# Patient Record
Sex: Female | Born: 1970 | Race: White | Hispanic: No | Marital: Married | Smoking: Never smoker
Health system: Southern US, Community
[De-identification: ages and names within clinical notes are randomized; demographics above are authoritative.]

## PROBLEM LIST (undated history)

## (undated) DIAGNOSIS — E079 Disorder of thyroid, unspecified: Secondary | ICD-10-CM

## (undated) HISTORY — PX: CHOLECYSTECTOMY: SHX55

---

## 2011-05-10 ENCOUNTER — Other Ambulatory Visit: Payer: Self-pay | Admitting: Family Medicine

## 2011-05-10 ENCOUNTER — Ambulatory Visit
Admission: RE | Admit: 2011-05-10 | Discharge: 2011-05-10 | Disposition: A | Discharge status: Home / Self Care | Payer: 59 | Source: Ambulatory Visit | Attending: Family Medicine | Admitting: Family Medicine

## 2011-05-10 DIAGNOSIS — Z139 Encounter for screening, unspecified: Secondary | ICD-10-CM

## 2013-07-27 ENCOUNTER — Emergency Department (HOSPITAL_COMMUNITY)
Admission: EM | Admit: 2013-07-27 | Discharge: 2013-07-28 | Disposition: A | Discharge status: Home / Self Care | Payer: BC Managed Care – PPO | Attending: Emergency Medicine | Admitting: Emergency Medicine

## 2013-07-27 ENCOUNTER — Encounter (HOSPITAL_COMMUNITY): Payer: Self-pay | Admitting: Emergency Medicine

## 2013-07-27 DIAGNOSIS — E079 Disorder of thyroid, unspecified: Secondary | ICD-10-CM | POA: Insufficient documentation

## 2013-07-27 DIAGNOSIS — N23 Unspecified renal colic: Secondary | ICD-10-CM

## 2013-07-27 DIAGNOSIS — N201 Calculus of ureter: Secondary | ICD-10-CM

## 2013-07-27 HISTORY — DX: Disorder of thyroid, unspecified: E07.9

## 2013-07-27 LAB — COMPREHENSIVE METABOLIC PANEL
ALT: 18 U/L (ref 0–35)
AST: 21 U/L (ref 0–37)
Albumin: 4 g/dL (ref 3.5–5.2)
Alkaline Phosphatase: 85 U/L (ref 39–117)
BUN: 13 mg/dL (ref 6–23)
CHLORIDE: 105 meq/L (ref 96–112)
CO2: 26 meq/L (ref 19–32)
CREATININE: 0.76 mg/dL (ref 0.50–1.10)
Calcium: 9.3 mg/dL (ref 8.4–10.5)
GFR calc Af Amer: >90 mL/min (ref >90)
GFR calc non Af Amer: >90 mL/min (ref >90)
Glucose, Bld: 114 mg/dL — ABNORMAL HIGH (ref 70–99)
Potassium: 3.9 mEq/L (ref 3.7–5.3)
Sodium: 143 mEq/L (ref 137–147)
Total Protein: 7.8 g/dL (ref 6.0–8.3)

## 2013-07-27 LAB — URINALYSIS, ROUTINE W REFLEX MICROSCOPIC
Bilirubin Urine: NEGATIVE
Glucose, UA: NEGATIVE mg/dL
KETONES UR: 15 mg/dL — AB
Nitrite: NEGATIVE
PROTEIN: 30 mg/dL — AB
Specific Gravity, Urine: 1.029 (ref 1.005–1.030)
Urobilinogen, UA: 0.2 mg/dL (ref 0.0–1.0)
pH: 5.5 (ref 5.0–8.0)

## 2013-07-27 LAB — URINE MICROSCOPIC-ADD ON

## 2013-07-27 LAB — CBC WITH DIFFERENTIAL/PLATELET
Basophils Absolute: 0 10*3/uL (ref 0.0–0.1)
Basophils Relative: 0 % (ref 0–1)
Eosinophils Absolute: 0 10*3/uL (ref 0.0–0.7)
Eosinophils Relative: 0 % (ref 0–5)
HEMATOCRIT: 44.4 % (ref 36.0–46.0)
HEMOGLOBIN: 15.1 g/dL — AB (ref 12.0–15.0)
LYMPHS ABS: 1.3 10*3/uL (ref 0.7–4.0)
LYMPHS PCT: 15 % (ref 12–46)
MCH: 28.8 pg (ref 26.0–34.0)
MCHC: 34 g/dL (ref 30.0–36.0)
MCV: 84.6 fL (ref 78.0–100.0)
MONO ABS: 0.5 10*3/uL (ref 0.1–1.0)
Monocytes Relative: 6 % (ref 3–12)
Neutro Abs: 6.6 10*3/uL (ref 1.7–7.7)
Neutrophils Relative %: 79 % — ABNORMAL HIGH (ref 43–77)
Platelets: 203 10*3/uL (ref 150–400)
RBC: 5.25 MIL/uL — ABNORMAL HIGH (ref 3.87–5.11)
RDW: 13.2 % (ref 11.5–15.5)
WBC: 8.5 10*3/uL (ref 4.0–10.5)

## 2013-07-27 LAB — PREGNANCY, URINE: PREG TEST UR: NEGATIVE

## 2013-07-27 MED ORDER — MORPHINE SULFATE 4 MG/ML IJ SOLN
4.0000 mg | Freq: Once | INTRAMUSCULAR | Status: AC
  Administered 2013-07-27: 4 mg via INTRAVENOUS
  Filled 2013-07-27: qty 1

## 2013-07-27 MED ORDER — IOHEXOL 300 MG/ML  SOLN
20.0000 mL | INTRAMUSCULAR | Status: AC
  Administered 2013-07-27: 25 mL via ORAL

## 2013-07-27 MED ORDER — ONDANSETRON HCL 4 MG/2ML IJ SOLN
4.0000 mg | Freq: Once | INTRAMUSCULAR | Status: AC
Start: 2013-07-27 — End: 2013-07-27
  Administered 2013-07-27: 4 mg via INTRAVENOUS
  Filled 2013-07-27: qty 2

## 2013-07-27 NOTE — ED Provider Notes (Signed)
CSN: [id number]     Arrival date & time 07/27/13  2107 History   First MD Initiated Contact with Patient 07/27/13 2151     Chief Complaint  Patient presents with  . Abdominal Pain     (Consider location/radiation/quality/duration/timing/severity/associated sxs/prior Treatment) HPI Comments: Patient is a 43 year old female with history of cholecystectomy in the past. She presents today with complaints of pain in the right abdomen. She said this started this morning. She has become increasingly nauseated and actually vomited one time. She denies any fevers or chills. She denies any urinary complaints. Her last menstrual period was one month ago and normal. She denies the possibility of pregnancy.  She was seen by her primary Dr. this afternoon and advised to come here for workup and rule out of appendicitis.  Patient is a 43 y.o. female presenting with abdominal pain. The history is provided by the patient.  Abdominal Pain Pain location:  RLQ Pain quality: cramping   Pain radiates to:  Does not radiate Pain severity:  Moderate Onset quality:  Gradual Duration:  1 day Timing:  Constant Progression:  Worsening Chronicity:  New Relieved by:  Nothing Worsened by:  Nothing tried Ineffective treatments:  None tried Associated symptoms: chills and nausea   Associated symptoms: no fever     Past Medical History  Diagnosis Date  . Thyroid disease    Past Surgical History  Procedure Laterality Date  . Cholecystectomy     No family history on file. History  Substance Use Topics  . Smoking status: Never Smoker   . Smokeless tobacco: Not on file  . Alcohol Use: No   OB History   Grav Para Term Preterm Abortions TAB SAB Ect Mult Living                 Review of Systems  Constitutional: Positive for chills. Negative for fever.  Gastrointestinal: Positive for nausea and abdominal pain.  All other systems reviewed and are negative.     Allergies  Codeine  Home Medications    Prior to Admission medications   Medication Sig Start Date End Date Taking? Authorizing Provider  B Complex Vitamins (VITAMIN B COMPLEX PO) Take 1 tablet by mouth daily.   Yes Historical Provider, MD  cholecalciferol (VITAMIN D) 1000 UNITS tablet Take 1,000 Units by mouth daily.   Yes Historical Provider, MD  Cyanocobalamin (VITAMIN B-12 PO) Take 1 tablet by mouth daily.   Yes Historical Provider, MD  levothyroxine (SYNTHROID, LEVOTHROID) 100 MCG tablet Take 100 mcg by mouth daily before breakfast.   Yes Historical Provider, MD  loratadine (CLARITIN) 10 MG tablet Take 10 mg by mouth daily.   Yes Historical Provider, MD  PRESCRIPTION MEDICATION Take 1 tablet by mouth daily.   Yes Historical Provider, MD  sodium chloride (OCEAN) 0.65 % SOLN nasal spray Place 1 spray into both nostrils 2 (two) times daily as needed for congestion.   Yes Historical Provider, MD   BP 141/95  Pulse 84  Temp(Src) 98.5 F (36.9 C) (Oral)  Resp 14  Ht 5\' 1"  (1.549 m)  Wt 203 lb (92.08 kg)  BMI 38.38 kg/m2  SpO2 99%  LMP 06/27/2013 Physical Exam  Nursing note and vitals reviewed. Constitutional: She is oriented to person, place, and time. She appears well-developed and well-nourished. No distress.  HENT:  Head: Normocephalic and atraumatic.  Neck: Normal range of motion. Neck supple.  Cardiovascular: Normal rate and regular rhythm.  Exam reveals no gallop and no friction rub.  No murmur heard. Pulmonary/Chest: Effort normal and breath sounds normal. No respiratory distress. She has no wheezes.  Abdominal: Soft. Bowel sounds are normal. She exhibits no distension. There is tenderness.  There is tenderness to palpation in the right lower quadrant. There is no rebound and no guarding. Bowel sounds are present.  Musculoskeletal: Normal range of motion.  Neurological: She is alert and oriented to person, place, and time.  Skin: Skin is warm and dry. She is not diaphoretic.    ED Course  Procedures  (including critical care time) Labs Review Labs Reviewed  CBC WITH DIFFERENTIAL - Abnormal; Notable for the following:    RBC 5.25 (*)    Hemoglobin 15.1 (*)    Neutrophils Relative % 79 (*)    All other components within normal limits  PREGNANCY, URINE  COMPREHENSIVE METABOLIC PANEL  URINALYSIS, ROUTINE W REFLEX MICROSCOPIC    Imaging Review No results found.   EKG Interpretation None      MDM   Final diagnoses:  None    Patient presents with complaints of right lower quadrant abdominal pain. She was sent here from the primary doctor's office for evaluation and rule out of appendicitis. Her white count is normal, however she does have a good exam for appendicitis. CT scan will be obtained. Care will be signed at shift change Dr. Lavella LemonsManly. She will obtain the results of the CT and determine the final disposition.    Geoffery Lyonsouglas Najwa Spillane, MD 07/30/13 [phone]

## 2013-07-27 NOTE — ED Notes (Signed)
Pt c/o lower rt quadrant pain that started this morning. Pt has had nausea/vomiting. [facility] and was sent her to rule out appendicitis. Pt is worse when she eats, gets better when she throws up. Pt rates pain 8-9/10. Describes pain as dull with occasional sharp pains.

## 2013-07-27 NOTE — ED Notes (Signed)
Pt. reports right/low abdominal pain with nausea and vomitting onset today with no fever or chills, seen at [facility] advised to go to ER to rule out appendicitis .

## 2013-07-28 ENCOUNTER — Emergency Department (HOSPITAL_COMMUNITY): Payer: BC Managed Care – PPO

## 2013-07-28 MED ORDER — IBUPROFEN 600 MG PO TABS: 600.0000 mg | ORAL_TABLET | Freq: Four times a day (QID) | ORAL | Status: DC | PRN

## 2013-07-28 MED ORDER — HYDROCODONE-ACETAMINOPHEN 5-325 MG PO TABS: 1.0000 | ORAL_TABLET | ORAL | Status: DC | PRN

## 2013-07-28 MED ORDER — TAMSULOSIN HCL 0.4 MG PO CAPS: 0.4000 mg | ORAL_CAPSULE | Freq: Every day | ORAL | Status: DC

## 2013-07-28 MED ORDER — MORPHINE SULFATE 4 MG/ML IJ SOLN
4.0000 mg | Freq: Once | INTRAMUSCULAR | Status: AC
  Administered 2013-07-28: 4 mg via INTRAVENOUS
  Filled 2013-07-28: qty 1

## 2013-07-28 MED ORDER — ONDANSETRON HCL 4 MG PO TABS: 4.0000 mg | ORAL_TABLET | Freq: Four times a day (QID) | ORAL | Status: DC

## 2013-07-28 MED ORDER — IOHEXOL 300 MG/ML  SOLN
100.0000 mL | Freq: Once | INTRAMUSCULAR | Status: AC | PRN
  Administered 2013-07-28: 100 mL via INTRAVENOUS

## 2013-07-28 NOTE — ED Notes (Signed)
Pt returned from ct

## 2013-07-28 NOTE — Discharge Instructions (Signed)
Kidney Stones Kidney stones (urolithiasis) are solid masses that form inside your kidneys. The intense pain is caused by the stone moving through the kidney, ureter, bladder, and urethra (urinary tract). When the stone moves, the ureter starts to spasm around the stone. The stone is usually passed in your pee (urine).  HOME CARE  Drink enough fluids to keep your pee clear or pale yellow. This helps to get the stone out.  Strain all pee through the provided strainer. Do not pee without peeing through the strainer, not even once. If you pee the stone out, catch it in the strainer. The stone may be as small as a grain of salt. Take this to your doctor. This will help your doctor figure out what you can do to try to prevent more kidney stones.  Only take medicine as told by your doctor.  Follow up with your doctor as told.  Get follow-up X-rays as told by your doctor. GET HELP IF: You have pain that gets worse even if you have been taking pain medicine. GET HELP RIGHT AWAY IF:   Your pain does not get better with medicine.  You have a fever or shaking chills.  Your pain increases and gets worse over 18 hours.  You have new belly (abdominal) pain.  You feel faint or pass out.  You are unable to pee. MAKE SURE YOU:   Understand these instructions.  Will watch your condition.  Will get help right away if you are not doing well or get worse. Document Released: 09/01/2007 Document Revised: 11/15/2012 Document Reviewed: 08/16/2012 [facility] Patient Information 2014 [place], [place].   PLEASE RETURN TO THE ED IF YOU DEVELOP FEVER, PAIN UNCONTROLLED BY PRESCRIBED MEDICATIONS OR IF YOU HAVE ANY OTHER URGENT HEALTH CONCERNS.

## 2013-07-28 NOTE — ED Provider Notes (Signed)
CT a/p reveals 7mm distal right ureteral stone. Patient required another dose of pain medication while awaiting results of CT scan. However, on my most recent visit with her, she is feeling better and says she is ready to go home. I have reviewed her diagnosis with her as well as plan for outpatient treatment and urology follow up. Patient counseled re: return precautions. Stable for discharge at this time.   Brandt LoosenJulie Manly, MD 07/28/13 [phone]

## 2013-07-28 NOTE — ED Notes (Signed)
Patient transported to CT 

## 2013-07-28 NOTE — ED Notes (Signed)
Discharge and follow up instructions reviewed. Pt verbalized understanding.  

## 2013-10-18 ENCOUNTER — Other Ambulatory Visit (HOSPITAL_COMMUNITY)
Admission: RE | Admit: 2013-10-18 | Discharge: 2013-10-18 | Disposition: A | Discharge status: Home / Self Care | Payer: BC Managed Care – PPO | Source: Ambulatory Visit | Attending: Family Medicine | Admitting: Family Medicine

## 2013-10-18 ENCOUNTER — Other Ambulatory Visit: Payer: Self-pay | Admitting: Family Medicine

## 2013-10-18 DIAGNOSIS — Z1151 Encounter for screening for human papillomavirus (HPV): Secondary | ICD-10-CM | POA: Insufficient documentation

## 2013-10-18 DIAGNOSIS — Z01419 Encounter for gynecological examination (general) (routine) without abnormal findings: Secondary | ICD-10-CM | POA: Insufficient documentation

## 2013-10-19 LAB — CYTOLOGY - PAP

## 2014-09-12 ENCOUNTER — Other Ambulatory Visit: Payer: Self-pay

## 2014-09-12 DIAGNOSIS — Z1231 Encounter for screening mammogram for malignant neoplasm of breast: Secondary | ICD-10-CM

## 2014-09-19 ENCOUNTER — Ambulatory Visit
Admission: RE | Admit: 2014-09-19 | Discharge: 2014-09-19 | Disposition: A | Discharge status: Home / Self Care | Payer: BC Managed Care – PPO | Source: Ambulatory Visit

## 2014-09-19 DIAGNOSIS — Z1231 Encounter for screening mammogram for malignant neoplasm of breast: Secondary | ICD-10-CM

## 2015-10-06 ENCOUNTER — Other Ambulatory Visit: Payer: Self-pay | Admitting: Family Medicine

## 2015-10-06 DIAGNOSIS — Z1231 Encounter for screening mammogram for malignant neoplasm of breast: Secondary | ICD-10-CM

## 2015-10-30 ENCOUNTER — Ambulatory Visit
Admission: RE | Admit: 2015-10-30 | Discharge: 2015-10-30 | Disposition: A | Discharge status: Home / Self Care | Payer: BC Managed Care – PPO | Source: Ambulatory Visit | Attending: Family Medicine | Admitting: Family Medicine

## 2015-10-30 DIAGNOSIS — Z1231 Encounter for screening mammogram for malignant neoplasm of breast: Secondary | ICD-10-CM

## 2016-10-20 ENCOUNTER — Other Ambulatory Visit: Payer: Self-pay | Admitting: Family Medicine

## 2016-10-20 DIAGNOSIS — Z1231 Encounter for screening mammogram for malignant neoplasm of breast: Secondary | ICD-10-CM

## 2016-10-29 ENCOUNTER — Other Ambulatory Visit: Payer: Self-pay | Admitting: Family Medicine

## 2016-10-29 ENCOUNTER — Other Ambulatory Visit (HOSPITAL_COMMUNITY)
Admission: RE | Admit: 2016-10-29 | Discharge: 2016-10-29 | Disposition: A | Discharge status: Home / Self Care | Payer: BC Managed Care – PPO | Source: Ambulatory Visit | Attending: Family Medicine | Admitting: Family Medicine

## 2016-10-29 DIAGNOSIS — Z01411 Encounter for gynecological examination (general) (routine) with abnormal findings: Secondary | ICD-10-CM | POA: Insufficient documentation

## 2016-11-01 ENCOUNTER — Ambulatory Visit
Admission: RE | Admit: 2016-11-01 | Discharge: 2016-11-01 | Disposition: A | Discharge status: Home / Self Care | Payer: BC Managed Care – PPO | Source: Ambulatory Visit | Attending: Family Medicine | Admitting: Family Medicine

## 2016-11-01 DIAGNOSIS — Z1231 Encounter for screening mammogram for malignant neoplasm of breast: Secondary | ICD-10-CM

## 2016-11-01 LAB — CYTOLOGY - PAP
Diagnosis: NEGATIVE
HPV: NOT DETECTED

## 2017-10-26 ENCOUNTER — Other Ambulatory Visit: Payer: Self-pay | Admitting: Family Medicine

## 2017-10-26 DIAGNOSIS — Z1231 Encounter for screening mammogram for malignant neoplasm of breast: Secondary | ICD-10-CM

## 2017-11-24 ENCOUNTER — Ambulatory Visit
Admission: RE | Admit: 2017-11-24 | Discharge: 2017-11-24 | Disposition: A | Discharge status: Home / Self Care | Payer: BC Managed Care – PPO | Source: Ambulatory Visit | Attending: Family Medicine | Admitting: Family Medicine

## 2017-11-24 DIAGNOSIS — Z1231 Encounter for screening mammogram for malignant neoplasm of breast: Secondary | ICD-10-CM

## 2018-10-17 ENCOUNTER — Other Ambulatory Visit: Payer: Self-pay | Admitting: Family Medicine

## 2018-10-17 DIAGNOSIS — Z1231 Encounter for screening mammogram for malignant neoplasm of breast: Secondary | ICD-10-CM

## 2018-11-29 ENCOUNTER — Ambulatory Visit
Admission: RE | Admit: 2018-11-29 | Discharge: 2018-11-29 | Disposition: A | Discharge status: Home / Self Care | Payer: BC Managed Care – PPO | Source: Ambulatory Visit | Attending: Family Medicine | Admitting: Family Medicine

## 2018-11-29 ENCOUNTER — Other Ambulatory Visit: Payer: Self-pay

## 2018-11-29 DIAGNOSIS — Z1231 Encounter for screening mammogram for malignant neoplasm of breast: Secondary | ICD-10-CM

## 2020-02-01 ENCOUNTER — Other Ambulatory Visit: Payer: Self-pay | Admitting: Family Medicine

## 2020-02-01 DIAGNOSIS — Z1231 Encounter for screening mammogram for malignant neoplasm of breast: Secondary | ICD-10-CM

## 2020-02-04 ENCOUNTER — Other Ambulatory Visit: Payer: Self-pay

## 2020-02-04 ENCOUNTER — Ambulatory Visit
Admission: RE | Admit: 2020-02-04 | Discharge: 2020-02-04 | Disposition: A | Discharge status: Home / Self Care | Payer: BC Managed Care – PPO | Source: Ambulatory Visit | Attending: Family Medicine | Admitting: Family Medicine

## 2020-02-04 DIAGNOSIS — Z1231 Encounter for screening mammogram for malignant neoplasm of breast: Secondary | ICD-10-CM

## 2020-04-24 ENCOUNTER — Other Ambulatory Visit (HOSPITAL_COMMUNITY)
Admission: RE | Admit: 2020-04-24 | Discharge: 2020-04-24 | Disposition: A | Discharge status: Home / Self Care | Payer: BC Managed Care – PPO | Source: Ambulatory Visit | Attending: Family Medicine | Admitting: Family Medicine

## 2020-04-24 DIAGNOSIS — Z01411 Encounter for gynecological examination (general) (routine) with abnormal findings: Secondary | ICD-10-CM | POA: Insufficient documentation

## 2020-04-29 LAB — CYTOLOGY - PAP
Diagnosis: NEGATIVE
Diagnosis: REACTIVE

## 2021-09-08 ENCOUNTER — Encounter (HOSPITAL_COMMUNITY): Payer: Self-pay | Admitting: Emergency Medicine

## 2021-09-08 ENCOUNTER — Emergency Department (HOSPITAL_COMMUNITY)
Admission: EM | Admit: 2021-09-08 | Discharge: 2021-09-08 | Disposition: A | Discharge status: Home / Self Care | Payer: BC Managed Care – PPO | Attending: Emergency Medicine | Admitting: Emergency Medicine

## 2021-09-08 ENCOUNTER — Emergency Department (HOSPITAL_COMMUNITY): Payer: BC Managed Care – PPO

## 2021-09-08 ENCOUNTER — Other Ambulatory Visit: Payer: Self-pay

## 2021-09-08 DIAGNOSIS — R072 Precordial pain: Secondary | ICD-10-CM | POA: Diagnosis present

## 2021-09-08 DIAGNOSIS — R079 Chest pain, unspecified: Secondary | ICD-10-CM

## 2021-09-08 DIAGNOSIS — R002 Palpitations: Secondary | ICD-10-CM | POA: Diagnosis not present

## 2021-09-08 LAB — BASIC METABOLIC PANEL
Anion gap: 10 (ref 5–15)
BUN: 11 mg/dL (ref 6–20)
CO2: 25 mmol/L (ref 22–32)
Calcium: 9.7 mg/dL (ref 8.9–10.3)
Chloride: 104 mmol/L (ref 98–111)
Creatinine, Ser: 0.64 mg/dL (ref 0.44–1.00)
GFR, Estimated: >60 mL/min (ref >60)
Glucose, Bld: 104 mg/dL — ABNORMAL HIGH (ref 70–99)
Potassium: 3.5 mmol/L (ref 3.5–5.1)
Sodium: 139 mmol/L (ref 135–145)

## 2021-09-08 LAB — CBC
HCT: 46.7 % — ABNORMAL HIGH (ref 36.0–46.0)
Hemoglobin: 15.9 g/dL — ABNORMAL HIGH (ref 12.0–15.0)
MCH: 29.2 pg (ref 26.0–34.0)
MCHC: 34 g/dL (ref 30.0–36.0)
MCV: 85.8 fL (ref 80.0–100.0)
Platelets: 243 10*3/uL (ref 150–400)
RBC: 5.44 MIL/uL — ABNORMAL HIGH (ref 3.87–5.11)
RDW: 13.1 % (ref 11.5–15.5)
WBC: 8.5 10*3/uL (ref 4.0–10.5)
nRBC: 0 % (ref 0.0–0.2)

## 2021-09-08 LAB — TROPONIN I (HIGH SENSITIVITY): Troponin I (High Sensitivity): 2 ng/L (ref <18)

## 2021-09-08 MED ORDER — METOPROLOL SUCCINATE ER 25 MG PO TB24: 12.5000 mg | ORAL_TABLET | Freq: Every day | ORAL | 2 refills | Status: AC

## 2021-09-08 NOTE — Discharge Instructions (Signed)
A prescription for metoprolol was sent to your pharmacy.  This is a medicine that treats blood pressure and palpitations.  You are prescribed a very low dose.  Continue to check your blood pressure and heart rate at home.  Talk to your primary care doctor about your response to this medication and any benefit you might get from dosing changes.  Return to the emergency department at any time for any new or worsening symptoms of concern.

## 2021-09-08 NOTE — ED Triage Notes (Addendum)
Pt reports she had chest pain intermittent since last night, pt reports elevated BP (168/110); palpitations with tingling to left arm when she lays flat; pt reports a physically exhausting day yesterday, pt reports her PCP recently prescribed ozempic for weight loss and palpitations and BP have been irregular since

## 2021-09-08 NOTE — ED Provider Notes (Signed)
[facility]-EMERGENCY DEPT Provider Note   CSN: [id number] Arrival date & time: 09/08/21  0445     History  Chief Complaint  Patient presents with   Chest Pain    Cristina Brown is a 51 y.o. female.   Chest Pain Associated symptoms: palpitations   Patient presents for chest pain.  Onset was last night.  Symptoms have been persistent since that time.  Patient describes her recent history as follows: She was started on Ozempic several months ago.  4 weeks ago, she increased her dose.  Since that dose increase, she has experienced intermittent palpitations.  She closely tracks her heart rate and blood pressure at home.  She has noticed an increase resting heart rate and elevated blood pressures.  She has no history of hypertension and does not take any medications for high blood pressure.  She had a physically active day yesterday.  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  Last night, she was laying in bed, she experienced a substernal chest discomfort.  She also experienced palpitations at this time and noticed that her blood pressure was again elevated.  Her symptoms have subsided this morning.  Patient has no known cardiac history and no family history.  She has been told that her bad cholesterol numbers elevated.  She has no other known risk factors for CAD.       Home Medications Prior to Admission medications   Medication Sig Start Date End Date Taking? Authorizing Provider  acetaminophen (TYLENOL) 325 MG tablet Take 650 mg by mouth daily as needed for moderate pain.   Yes [provider]  cholecalciferol (VITAMIN D) 1000 UNITS tablet Take 1,000 Units by mouth daily.   Yes [provider]  Cyanocobalamin (VITAMIN B-12 PO) Take 1 tablet by mouth daily.   Yes [provider]  levothyroxine (SYNTHROID, LEVOTHROID) 100 MCG tablet Take 100 mcg by mouth daily before breakfast.   Yes [provider]  loratadine  (CLARITIN) 10 MG tablet Take 10 mg by mouth daily.   Yes [provider]  metoprolol succinate (TOPROL-XL) 25 MG 24 hr tablet Take 0.5 tablets (12.5 mg total) by mouth daily. 09/08/21 12/07/21 Yes Gloris Manchester, MD  OZEMPIC, 1 MG/DOSE, 4 MG/3ML SOPN Inject 1 mg into the skin once a week. Tuesday 08/06/21  Yes [provider]      Allergies    Codeine    Review of Systems   Review of Systems  Cardiovascular:  Positive for chest pain and palpitations.  All other systems reviewed and are negative.   Physical Exam Updated Vital Signs BP (!) 158/101   Pulse 95   Temp 98.3 F (36.8 C) (Oral)   Resp 15   Ht 5\' 1"  (1.549 m)   Wt 88.9 kg   SpO2 98%   BMI 37.03 kg/m  Physical Exam Vitals and nursing note reviewed.  Constitutional:      General: She is not in acute distress.    Appearance: She is well-developed and normal weight. She is not ill-appearing, toxic-appearing or diaphoretic.  HENT:     Head: Normocephalic and atraumatic.  Eyes:     Extraocular Movements: Extraocular movements intact.     Conjunctiva/sclera: Conjunctivae normal.  Neck:     Vascular: No JVD.  Cardiovascular:     Rate and Rhythm: Normal rate and regular rhythm.     Heart sounds: Normal heart sounds. No murmur heard. Pulmonary:     Effort: Pulmonary  effort is normal. No respiratory distress.     Breath sounds: Normal breath sounds. No decreased breath sounds, wheezing, rhonchi or rales.  Abdominal:     Palpations: Abdomen is soft.     Tenderness: There is no abdominal tenderness.  Musculoskeletal:        General: No swelling. Normal range of motion.     Cervical back: Normal range of motion and neck supple.     Right lower leg: No edema.     Left lower leg: No edema.  Skin:    General: Skin is warm and dry.     Coloration: Skin is not cyanotic or pale.  Neurological:     General: No focal deficit present.     Mental Status: She is alert and oriented to person, place, and time.      Cranial Nerves: No cranial nerve deficit.     Motor: No weakness.  Psychiatric:        Mood and Affect: Mood normal.        Behavior: Behavior normal.     ED Results / Procedures / Treatments   Labs (all labs ordered are listed, but only abnormal results are displayed) Labs Reviewed  BASIC METABOLIC PANEL - Abnormal; Notable for the following components:      Result Value   Glucose, Bld 104 (*)    All other components within normal limits  CBC - Abnormal; Notable for the following components:   RBC 5.44 (*)    Hemoglobin 15.9 (*)    HCT 46.7 (*)    All other components within normal limits  TROPONIN I (HIGH SENSITIVITY)  TROPONIN I (HIGH SENSITIVITY)    EKG EKG Interpretation  Date/Time:  Tuesday September 08 2021 05:06:56 EDT Ventricular Rate:  95 PR Interval:  121 QRS Duration: 79 QT Interval:  346 QTC Calculation: 435 R Axis:   4 Text Interpretation: Sinus rhythm LVH by voltage Confirmed by Gloris Manchesterixon, Lyana Asbill ([id number]) on 09/08/2021 7:19:08 AM  Radiology DG Chest 2 View  Result Date: 09/08/2021 CLINICAL DATA:  51 year old female with chest pain, hypertension, palpitations. EXAM: CHEST - 2 VIEW COMPARISON:  Chest radiographs 05/10/2011. FINDINGS: Lower lung volumes. Mediastinal contours remain normal. Visualized tracheal air column is within normal limits. No pneumothorax, pulmonary edema, pleural effusion, or confluent pulmonary opacity. Stable cholecystectomy clips. No acute osseous abnormality identified. Negative visible bowel gas. IMPRESSION: Low lung volumes.  No acute cardiopulmonary abnormality. Electronically Signed   By: Odessa FlemingH  Hall M.D.   On: 09/08/2021 05:41    Procedures Procedures    Medications Ordered in ED Medications - No data to display  ED Course/ Medical Decision Making/ A&P                           Medical Decision Making Amount and/or Complexity of Data Reviewed Labs: ordered. Radiology: ordered.   This patient presents to the ED for concern of chest  pain, this involves an extensive number of treatment options, and is a complaint that carries with it a high risk of complications and morbidity.  The differential diagnosis includes ACS, pericarditis, anxiety, costochondritis   Co morbidities that complicate the patient evaluation  N/A   Additional history obtained:  Additional history obtained from N/A External records from outside source obtained and reviewed including EMR   Lab Tests:  I Ordered, and personally interpreted labs.  The pertinent results include: Normal electrolytes, no leukocytosis, normal troponin   Imaging Studies ordered:  I ordered imaging studies including chest x-ray I independently visualized and interpreted imaging which showed no acute findings I agree with the radiologist interpretation   Cardiac Monitoring: / EKG:  The patient was maintained on a cardiac monitor.  I personally viewed and interpreted the cardiac monitored which showed an underlying rhythm of: Sinus rhythm  Problem List / ED Course / Critical interventions / Medication management  Patient is a 51 year old female presenting for chest pain that she noticed last night while laying in bed.  Chest pain is currently resolved.  Prior to being bedded in the ED, patient underwent laboratory work-up.  Results of lab work are reassuring.  Patient's troponin is 2.  Vital signs are notable for moderate hypertension.  On assessment, patient is well-appearing.  She denies any current symptoms.  Based on EKG, risk factors, and symptoms, patient is low risk for CAD.  She was offered cardiology referral but stated that she would feel more comfortable just following up with her primary care doctor.  She was offered IV fluids and repeat troponin which she declined.  She states that she would like to go home.  I spoke with her about her elevated blood pressures and heart rate at home.  Patient would like to try low-dose metoprolol and follow-up with her primary  care doctor to discuss how her vital signs respond.  This was prescribed.  Patient is reliable and very closely tracks her vital signs and symptoms at home.  Given that her symptoms began last night, over 6 hours prior to her first troponin, I do not feel that she needs a second troponin.  Patient is stable for discharge at this time.   I have reviewed the patients home medicines and have made adjustments as needed   Social Determinants of Health:  Has access to outpatient care         Final Clinical Impression(s) / ED Diagnoses Final diagnoses:  Chest pain, unspecified type  Palpitations    Rx / DC Orders ED Discharge Orders          Ordered    metoprolol succinate (TOPROL-XL) 25 MG 24 hr tablet  Daily        09/08/21 0811              Gloris Manchester, MD 09/08/21 [phone]

## 2021-10-26 ENCOUNTER — Other Ambulatory Visit: Payer: Self-pay | Admitting: Family Medicine

## 2021-10-26 DIAGNOSIS — Z1231 Encounter for screening mammogram for malignant neoplasm of breast: Secondary | ICD-10-CM

## 2021-11-03 ENCOUNTER — Ambulatory Visit
Admission: RE | Admit: 2021-11-03 | Discharge: 2021-11-03 | Disposition: A | Discharge status: Home / Self Care | Payer: BC Managed Care – PPO | Source: Ambulatory Visit | Attending: Family Medicine | Admitting: Family Medicine

## 2021-11-03 DIAGNOSIS — Z1231 Encounter for screening mammogram for malignant neoplasm of breast: Secondary | ICD-10-CM

## 2022-04-19 IMAGING — MG DIGITAL SCREENING BILAT W/ TOMO W/ CAD
5 series · 6 of 13 positions shown · non-contrast
Comparison: Previous exam(s).

CLINICAL DATA: Screening.

EXAM:
DIGITAL SCREENING BILATERAL MAMMOGRAM WITH TOMO AND CAD

[R MLO synth-2D]
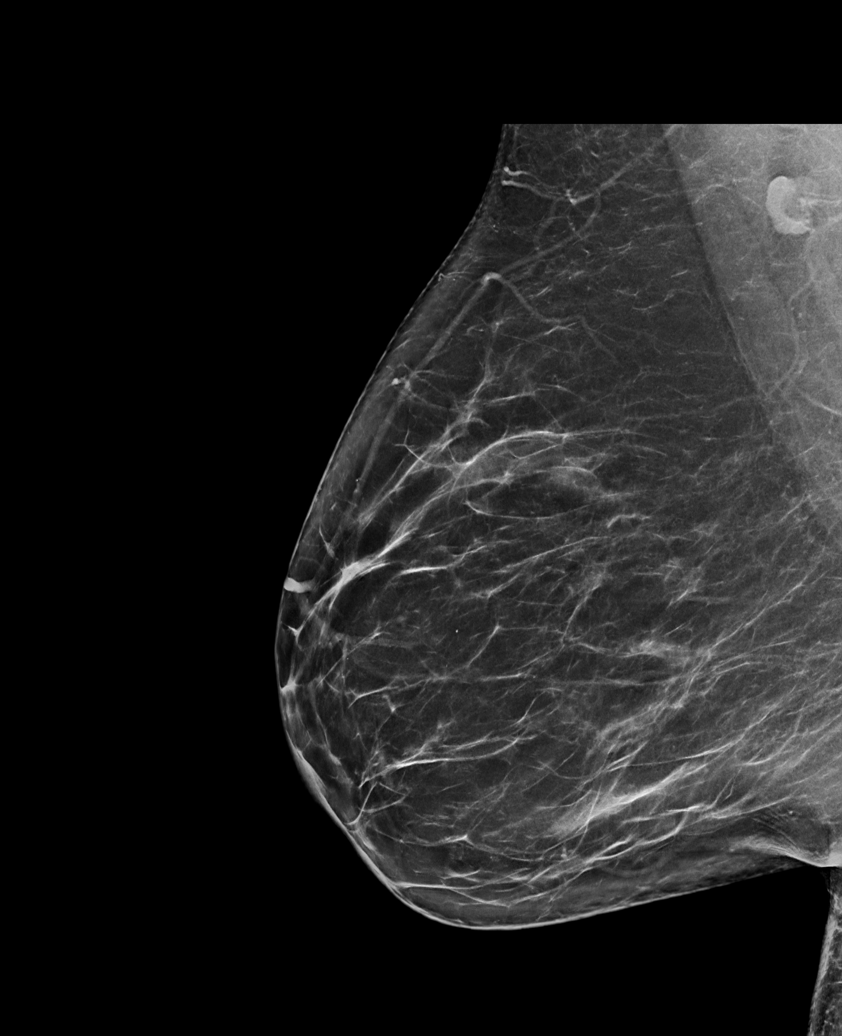

[R CC synth-2D]
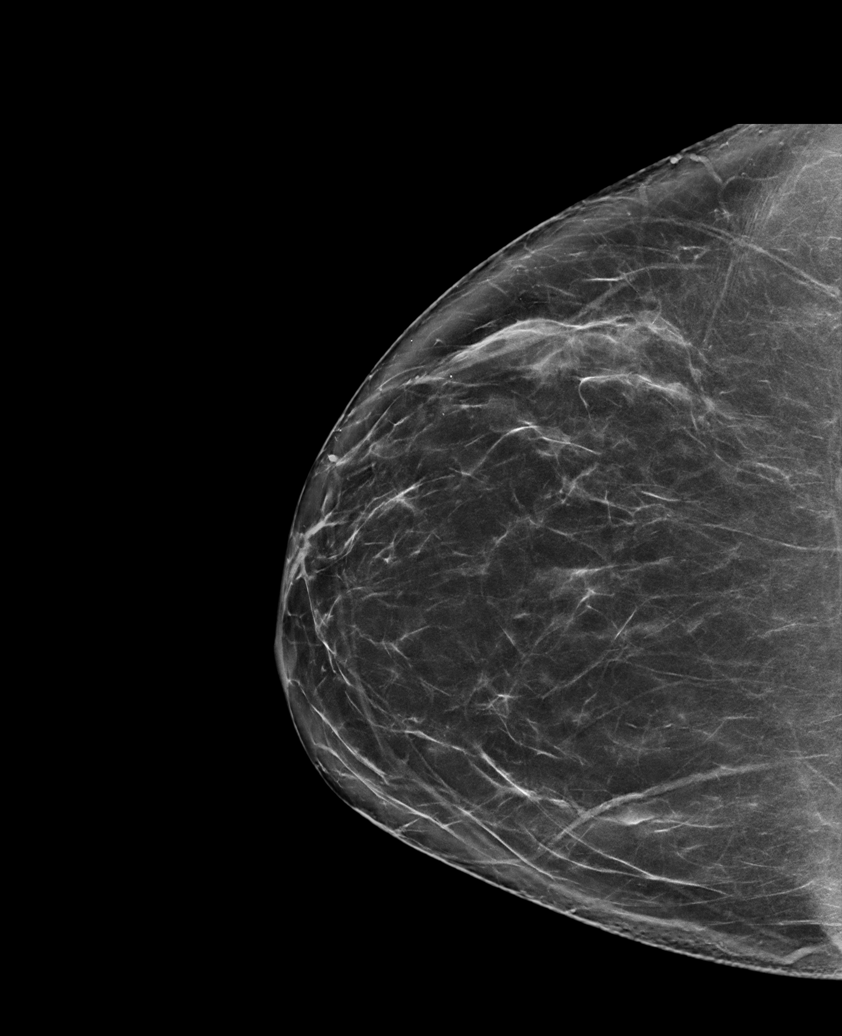

[L CC synth-2D]
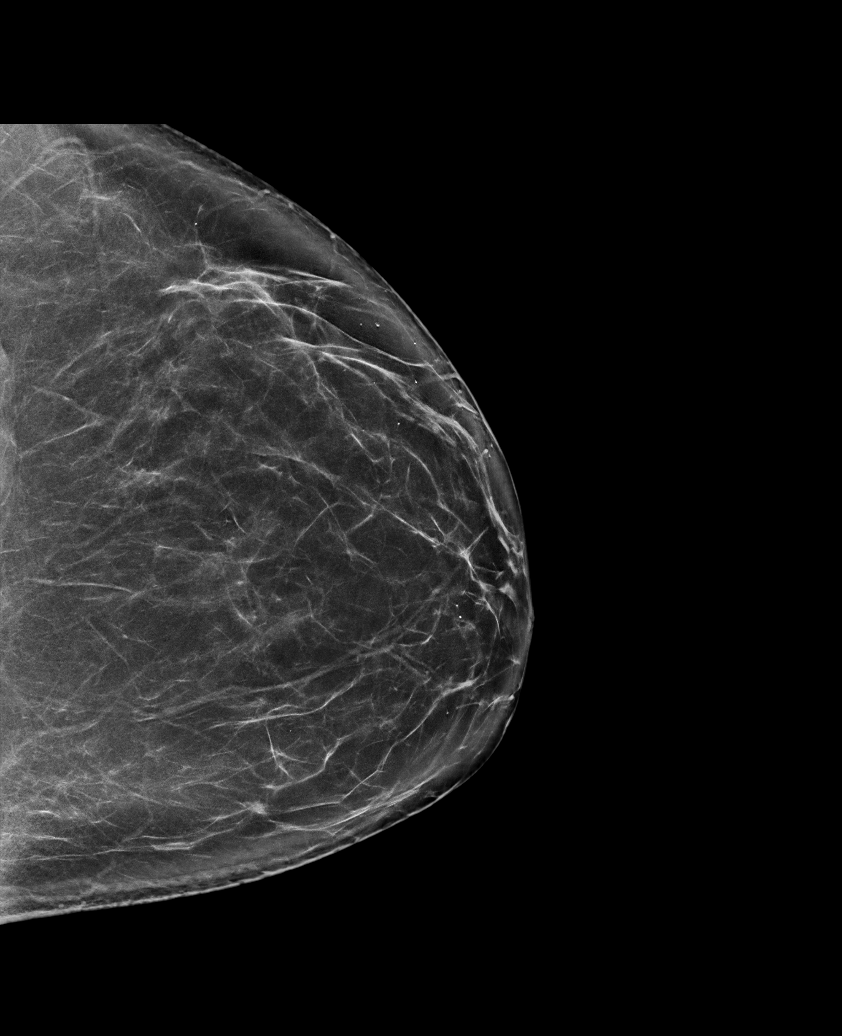

[L MLO tomo · 2 of 90 frames shown]
[frame 29/90]
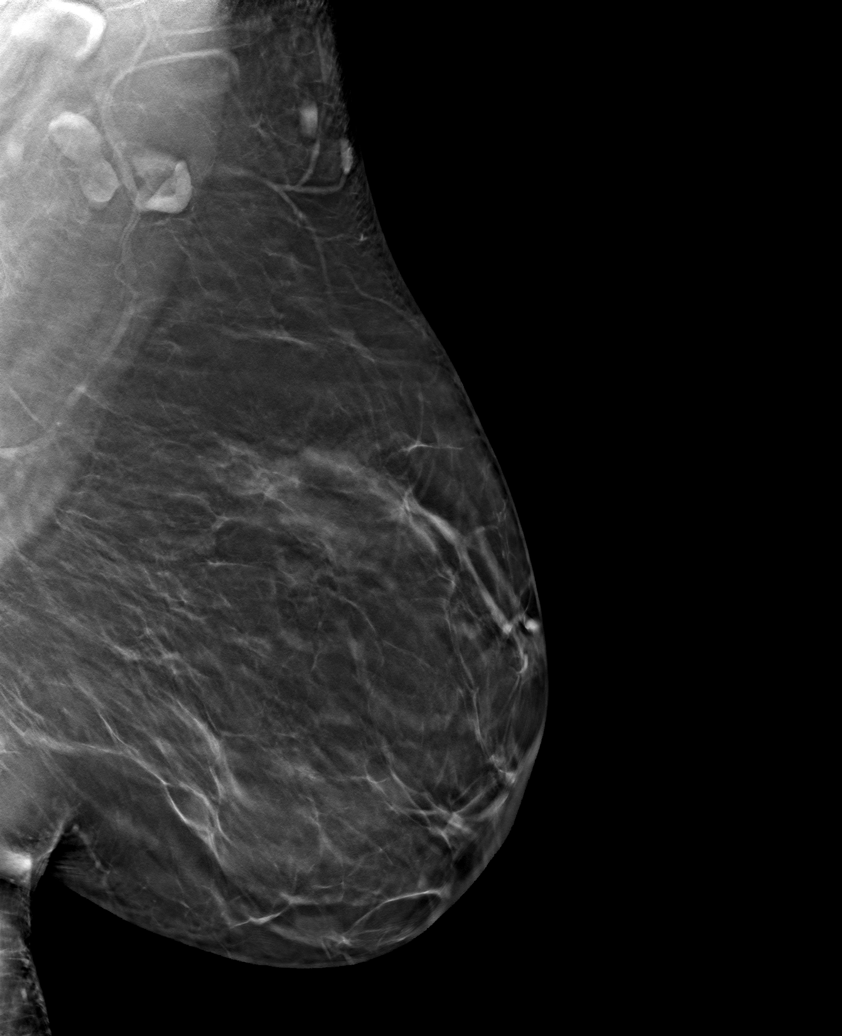
[frame 45/90]
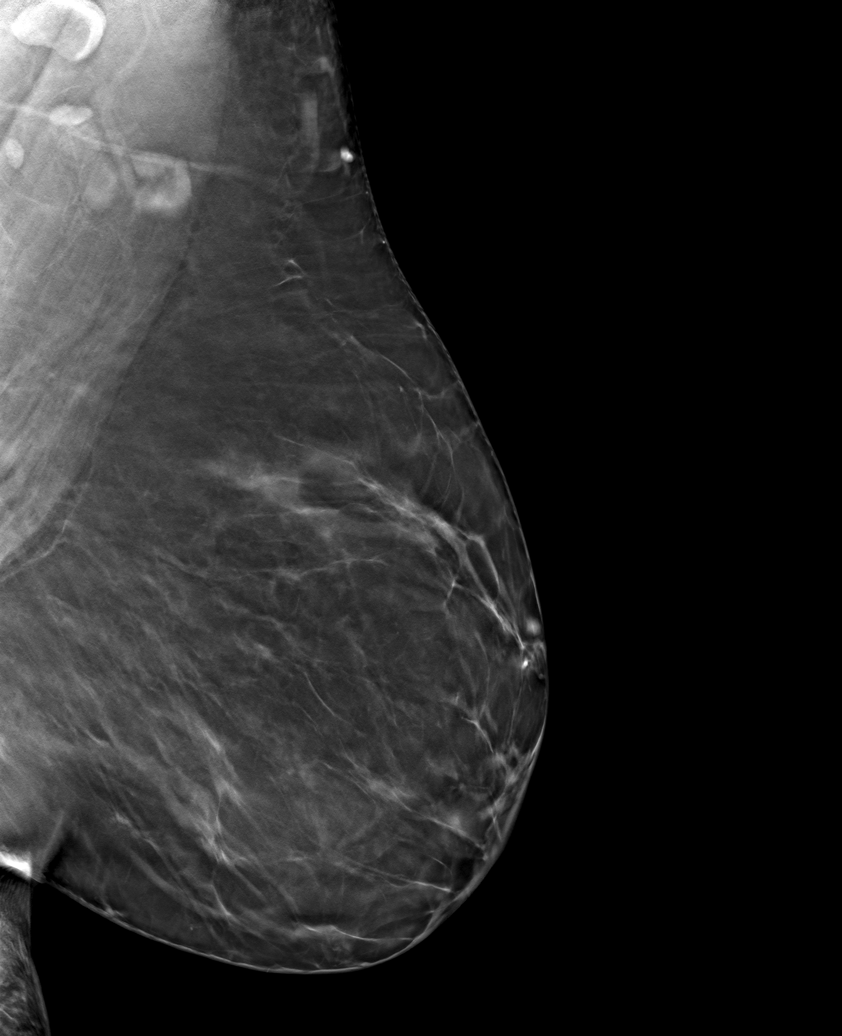

[R MLO tomo · tomo slice 44/87.0]
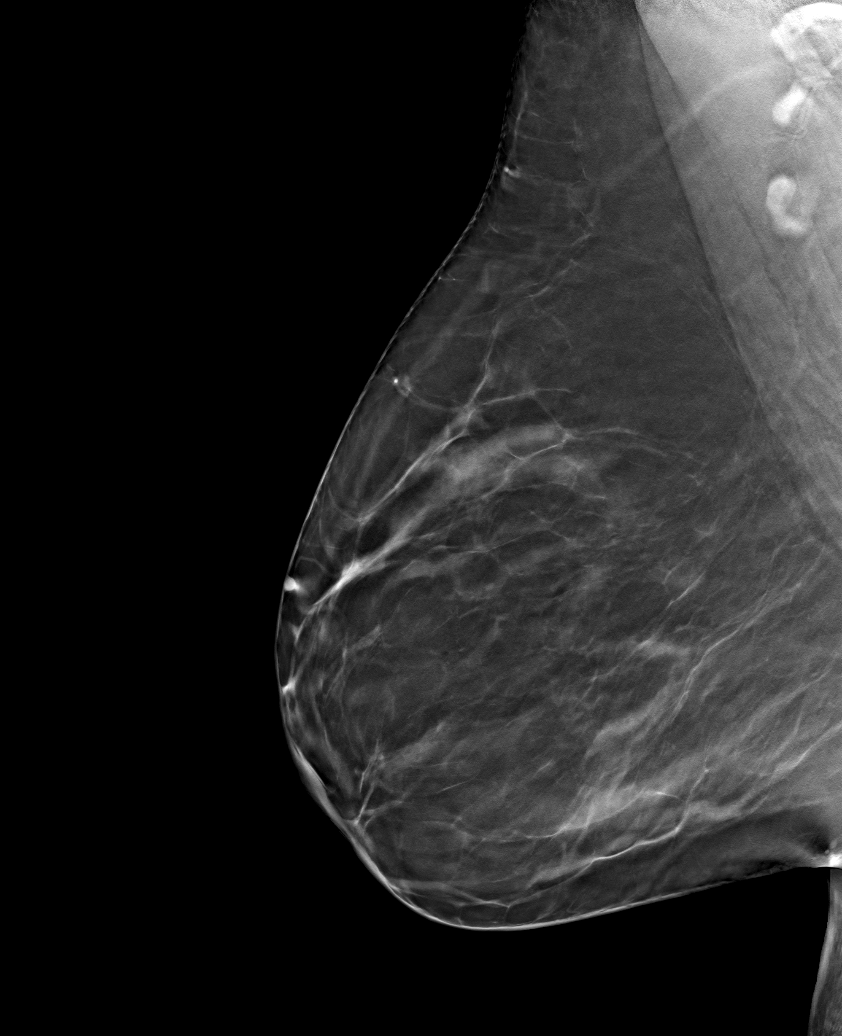

[6 of 13 positions shown; findings below may reference images not displayed]

ACR Breast Density Category b: There are scattered areas of
fibroglandular density.
FINDINGS: There are no findings suspicious for malignancy. Images were
processed with CAD.
IMPRESSION: No mammographic evidence of malignancy. A result letter of this
screening mammogram will be mailed directly to the patient.

RECOMMENDATION:
Screening mammogram in one year. (Code:CN-[id number])

BI-RADS CATEGORY  1: Negative.

## 2023-02-04 ENCOUNTER — Ambulatory Visit
Admission: RE | Admit: 2023-02-04 | Discharge: 2023-02-04 | Disposition: A | Discharge status: Home / Self Care | Payer: BC Managed Care – PPO | Source: Ambulatory Visit | Attending: Family Medicine | Admitting: Family Medicine

## 2023-02-04 ENCOUNTER — Other Ambulatory Visit: Payer: Self-pay | Admitting: Family Medicine

## 2023-02-04 ENCOUNTER — Encounter: Payer: Self-pay | Admitting: Family Medicine

## 2023-02-04 DIAGNOSIS — R109 Unspecified abdominal pain: Secondary | ICD-10-CM

## 2023-02-04 MED ORDER — IOPAMIDOL (ISOVUE-300) INJECTION 61%
100.0000 mL | Freq: Once | INTRAVENOUS | Status: AC | PRN
  Administered 2023-02-04: 100 mL via INTRAVENOUS

## 2023-07-21 ENCOUNTER — Other Ambulatory Visit (HOSPITAL_COMMUNITY)
Admission: RE | Admit: 2023-07-21 | Discharge: 2023-07-21 | Disposition: A | Discharge status: Home / Self Care | Source: Ambulatory Visit | Attending: Family Medicine | Admitting: Family Medicine

## 2023-07-21 DIAGNOSIS — Z01411 Encounter for gynecological examination (general) (routine) with abnormal findings: Secondary | ICD-10-CM | POA: Insufficient documentation

## 2023-07-25 LAB — CYTOLOGY - PAP
Comment: NEGATIVE
Diagnosis: NEGATIVE
High risk HPV: NEGATIVE

## 2023-08-01 ENCOUNTER — Encounter: Payer: Self-pay | Admitting: Family Medicine

## 2023-08-03 ENCOUNTER — Other Ambulatory Visit: Payer: Self-pay | Admitting: Family Medicine

## 2023-08-03 DIAGNOSIS — E785 Hyperlipidemia, unspecified: Secondary | ICD-10-CM

## 2023-08-03 DIAGNOSIS — I10 Essential (primary) hypertension: Secondary | ICD-10-CM

## 2023-10-05 ENCOUNTER — Other Ambulatory Visit: Payer: Self-pay | Admitting: Family Medicine

## 2023-10-05 DIAGNOSIS — Z1231 Encounter for screening mammogram for malignant neoplasm of breast: Secondary | ICD-10-CM

## 2023-10-21 ENCOUNTER — Ambulatory Visit
Admission: RE | Admit: 2023-10-21 | Discharge: 2023-10-21 | Disposition: A | Discharge status: Home / Self Care | Source: Ambulatory Visit | Attending: Family Medicine | Admitting: Family Medicine

## 2023-10-21 DIAGNOSIS — Z1231 Encounter for screening mammogram for malignant neoplasm of breast: Secondary | ICD-10-CM

## 2023-11-22 IMAGING — CR DG CHEST 2V
2 series · 2 of 2 positions shown · non-contrast
Comparison: Chest radiographs 05/10/2011.

CLINICAL DATA: 50-year-old female with chest pain, hypertension,
palpitations.

EXAM:
CHEST - 2 VIEW

[w chest pa]
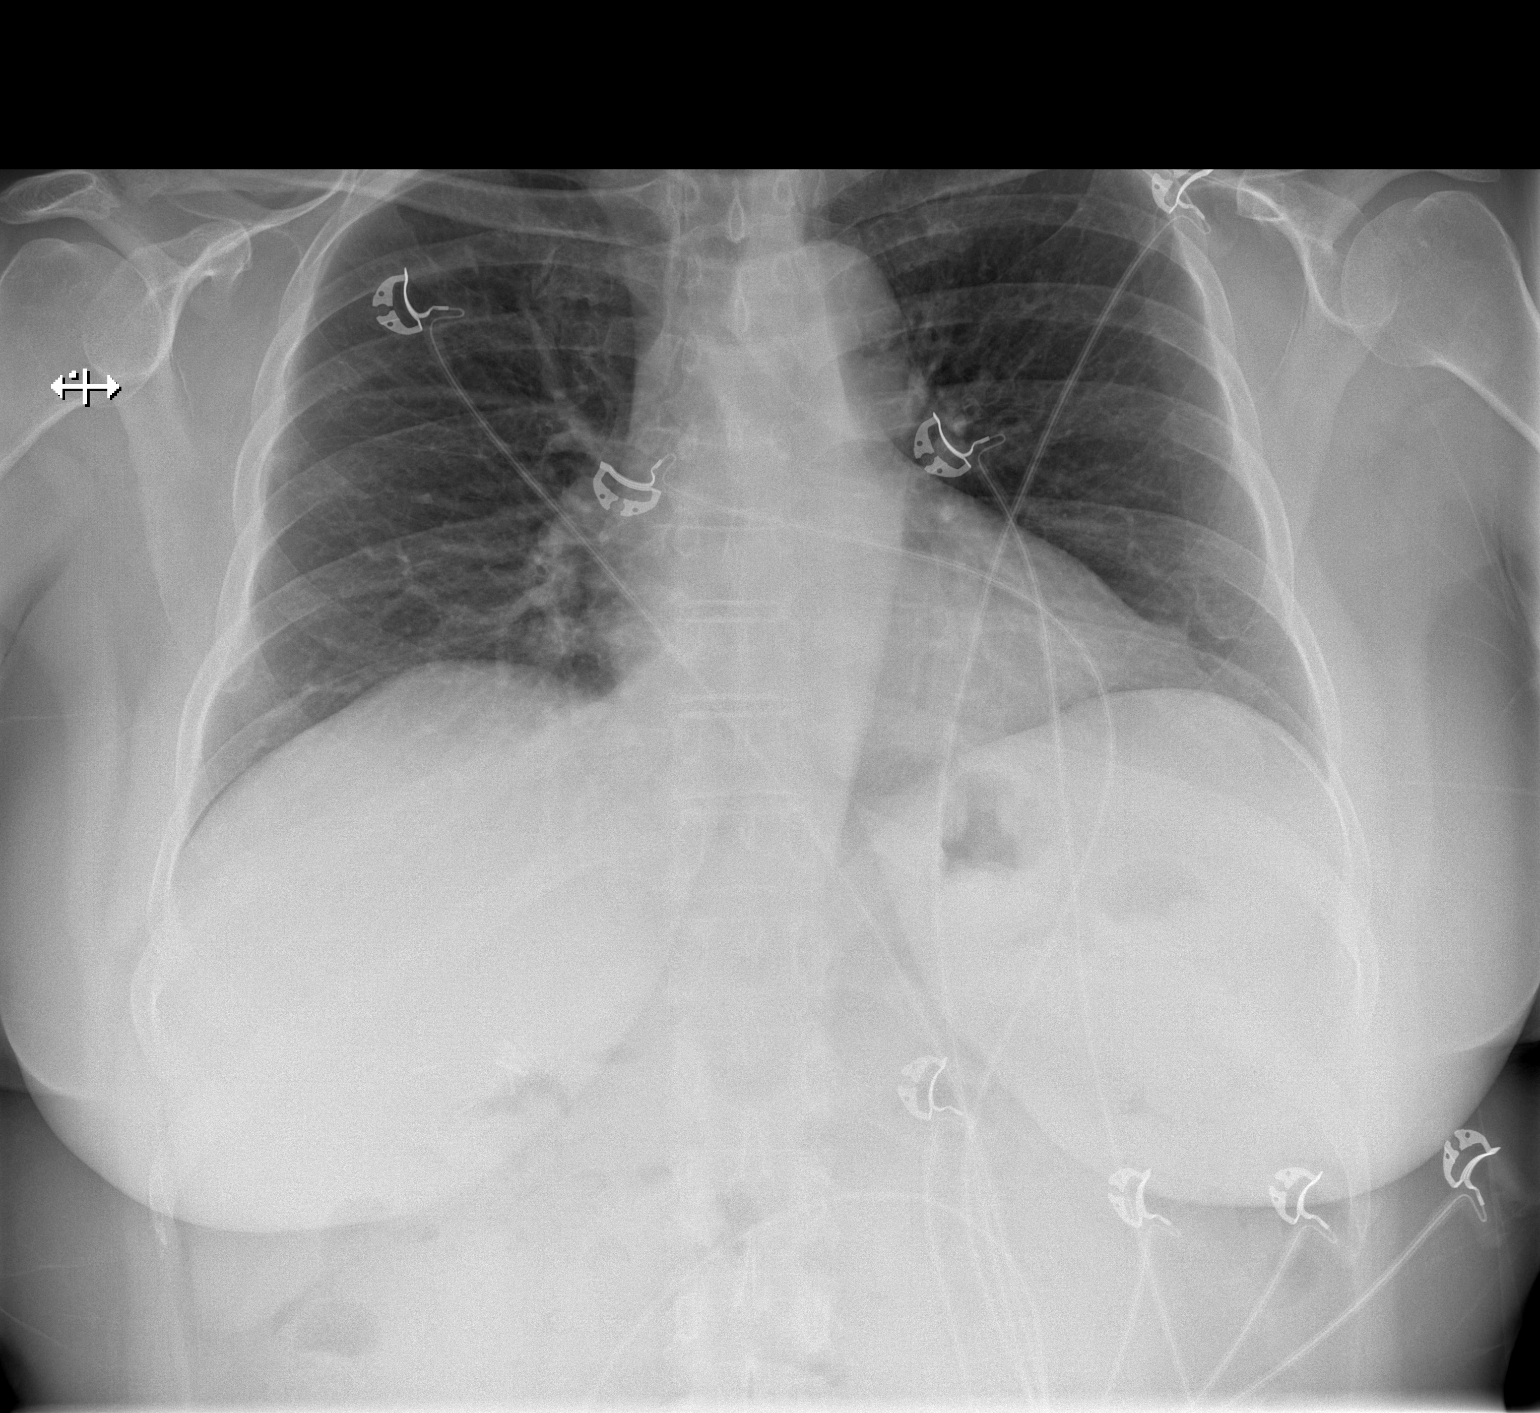

[w chest lat]
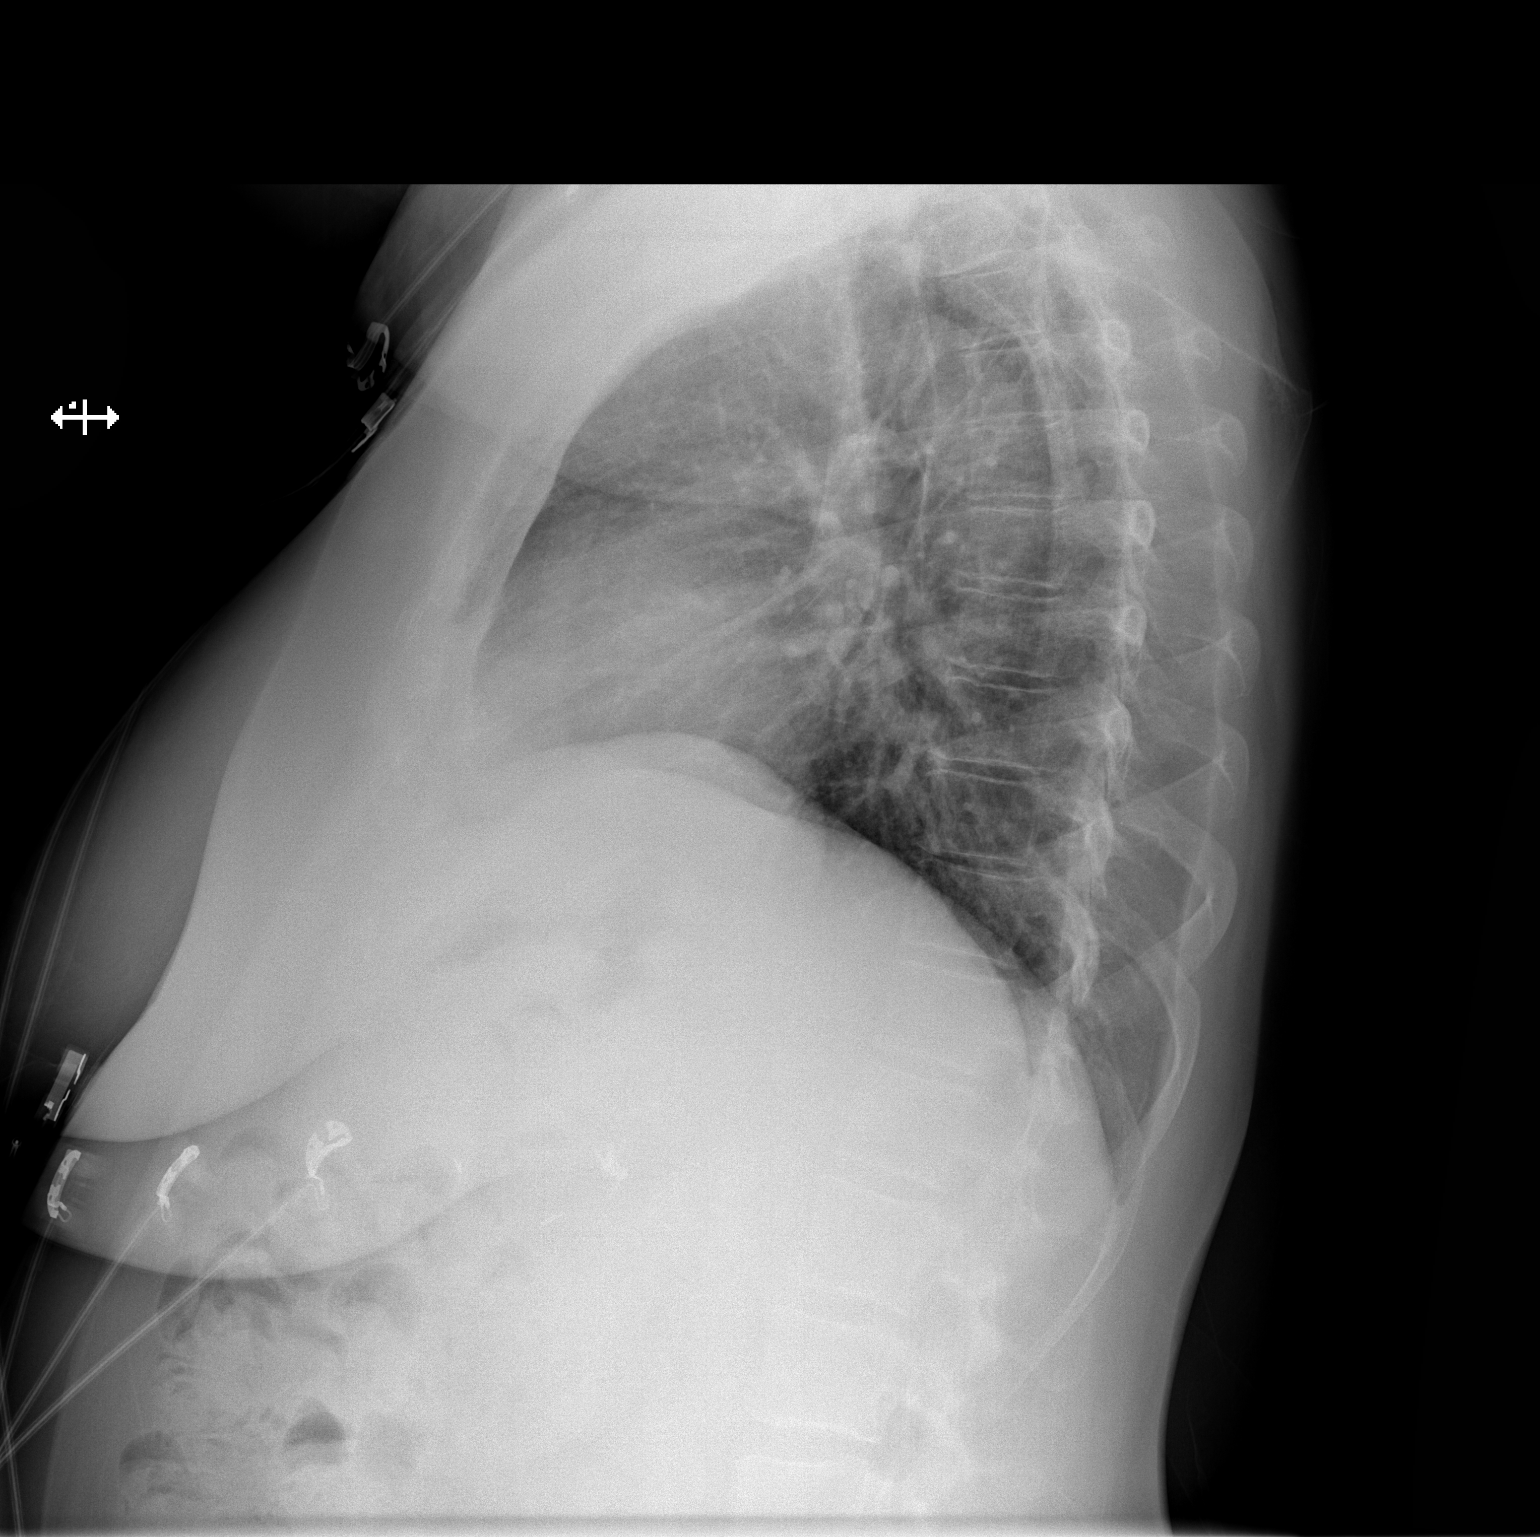

[2 of 2 positions shown; findings below may reference images not displayed]

FINDINGS: Lower lung volumes. Mediastinal contours remain normal. Visualized
tracheal air column is within normal limits. No pneumothorax,
pulmonary edema, pleural effusion, or confluent pulmonary opacity.
Stable cholecystectomy clips. No acute osseous abnormality
identified. Negative visible bowel gas.
IMPRESSION: Low lung volumes.  No acute cardiopulmonary abnormality.
